# Patient Record
Sex: Male | Born: 2000 | Race: Black or African American | Hispanic: No | Marital: Single | Smoking: Never smoker
Health system: Southern US, Community
[De-identification: ages and names within clinical notes are randomized; demographics above are authoritative.]

## PROBLEM LIST (undated history)

## (undated) DIAGNOSIS — F419 Anxiety disorder, unspecified: Secondary | ICD-10-CM

## (undated) HISTORY — PX: WRIST SURGERY: SHX841

## (undated) HISTORY — PX: WISDOM TOOTH EXTRACTION: SHX21

---
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
## 2020-01-06 ENCOUNTER — Ambulatory Visit: Payer: 59 | Attending: Internal Medicine

## 2020-01-16 ENCOUNTER — Ambulatory Visit: Payer: 59 | Attending: Internal Medicine

## 2020-01-16 DIAGNOSIS — Z23 Encounter for immunization: Secondary | ICD-10-CM

## 2020-01-16 NOTE — Progress Notes (Signed)
   Covid-19 Vaccination Clinic  Name:  Haji Delaine    MRN: [id number] DOB: May 14, 2001  01/16/2020  Mr. Rasmusson was observed post Covid-19 immunization for 15 minutes without incident. He was provided with Vaccine Information Sheet and instruction to access the V-Safe system.   Mr. Gossman was instructed to call 911 with any severe reactions post vaccine: Marland Kitchen Difficulty breathing  . Swelling of face and throat  . A fast heartbeat  . A bad rash all over body  . Dizziness and weakness   Immunizations Administered    Name Date Dose VIS Date Route   Pfizer COVID-19 Vaccine 01/16/2020 11:54 AM 0.3 mL 10/29/2018 Intramuscular   Manufacturer: [employer], [employer]   Lot: [id number]   NDC: [address]-2

## 2021-12-06 ENCOUNTER — Other Ambulatory Visit: Payer: Self-pay

## 2021-12-06 ENCOUNTER — Encounter: Payer: Self-pay | Admitting: Emergency Medicine

## 2021-12-06 DIAGNOSIS — K2920 Alcoholic gastritis without bleeding: Secondary | ICD-10-CM | POA: Diagnosis not present

## 2021-12-06 DIAGNOSIS — Z91010 Allergy to peanuts: Secondary | ICD-10-CM | POA: Insufficient documentation

## 2021-12-06 DIAGNOSIS — E860 Dehydration: Secondary | ICD-10-CM | POA: Diagnosis not present

## 2021-12-06 DIAGNOSIS — R112 Nausea with vomiting, unspecified: Secondary | ICD-10-CM | POA: Diagnosis present

## 2021-12-06 DIAGNOSIS — U071 COVID-19: Secondary | ICD-10-CM | POA: Insufficient documentation

## 2021-12-06 LAB — CBC
HCT: 46.1 % (ref 39.0–52.0)
Hemoglobin: 15.8 g/dL (ref 13.0–17.0)
MCH: 30.3 pg (ref 26.0–34.0)
MCHC: 34.3 g/dL (ref 30.0–36.0)
MCV: 88.5 fL (ref 80.0–100.0)
Platelets: 248 10*3/uL (ref 150–400)
RBC: 5.21 MIL/uL (ref 4.22–5.81)
RDW: 11.9 % (ref 11.5–15.5)
WBC: 5 10*3/uL (ref 4.0–10.5)
nRBC: 0 % (ref 0.0–0.2)

## 2021-12-06 LAB — COMPREHENSIVE METABOLIC PANEL
ALT: 15 U/L (ref 0–44)
AST: 22 U/L (ref 15–41)
Albumin: 4.2 g/dL (ref 3.5–5.0)
Alkaline Phosphatase: 64 U/L (ref 38–126)
Anion gap: 11 (ref 5–15)
BUN: 21 mg/dL — ABNORMAL HIGH (ref 6–20)
CO2: 24 mmol/L (ref 22–32)
Calcium: 9.3 mg/dL (ref 8.9–10.3)
Chloride: 103 mmol/L (ref 98–111)
Creatinine, Ser: 1.35 mg/dL — ABNORMAL HIGH (ref 0.61–1.24)
GFR, Estimated: >60 mL/min (ref >60)
Glucose, Bld: 108 mg/dL — ABNORMAL HIGH (ref 70–99)
Potassium: 3.8 mmol/L (ref 3.5–5.1)
Sodium: 138 mmol/L (ref 135–145)
Total Bilirubin: 1.2 mg/dL (ref 0.3–1.2)
Total Protein: 8.5 g/dL — ABNORMAL HIGH (ref 6.5–8.1)

## 2021-12-06 LAB — LIPASE, BLOOD: Lipase: 26 U/L (ref 11–51)

## 2021-12-06 LAB — RESP PANEL BY RT-PCR (FLU A&B, COVID) ARPGX2
Influenza A by PCR: NEGATIVE
Influenza B by PCR: NEGATIVE
SARS Coronavirus 2 by RT PCR: POSITIVE — AB

## 2021-12-06 NOTE — ED Triage Notes (Signed)
Pt to ED from home c/o n/v/d and vomiting blood since this morning.  States vomiting x3-4 and diarrhea x2-3.  States vomiting has been intermittently bright red and then dark vomit.  Generalized body aches.  Denies blood thinners.  States last drank alcohol on Thursday, smokes marijuana every day and last use last night.  Pt A&Ox4, chest rise even and unlabored, skin WNL and in NAD at this time. ?

## 2021-12-07 ENCOUNTER — Emergency Department
Admission: EM | Admit: 2021-12-07 | Discharge: 2021-12-07 | Disposition: A | Discharge status: Home / Self Care | Payer: No Typology Code available for payment source | Attending: Emergency Medicine | Admitting: Emergency Medicine

## 2021-12-07 DIAGNOSIS — R112 Nausea with vomiting, unspecified: Secondary | ICD-10-CM

## 2021-12-07 DIAGNOSIS — U071 COVID-19: Secondary | ICD-10-CM

## 2021-12-07 DIAGNOSIS — K2920 Alcoholic gastritis without bleeding: Secondary | ICD-10-CM

## 2021-12-07 DIAGNOSIS — E860 Dehydration: Secondary | ICD-10-CM

## 2021-12-07 HISTORY — DX: Anxiety disorder, unspecified: F41.9

## 2021-12-07 LAB — URINALYSIS, ROUTINE W REFLEX MICROSCOPIC
Bacteria, UA: NONE SEEN
Bilirubin Urine: NEGATIVE
Glucose, UA: NEGATIVE mg/dL
Hgb urine dipstick: NEGATIVE
Ketones, ur: 5 mg/dL — AB
Leukocytes,Ua: NEGATIVE
Nitrite: NEGATIVE
Protein, ur: 30 mg/dL — AB
Specific Gravity, Urine: 1.032 — ABNORMAL HIGH (ref 1.005–1.030)
Squamous Epithelial / HPF: NONE SEEN (ref 0–5)
pH: 5 (ref 5.0–8.0)

## 2021-12-07 MED ORDER — ONDANSETRON HCL 4 MG/2ML IJ SOLN
4.0000 mg | Freq: Once | INTRAMUSCULAR | Status: AC
  Administered 2021-12-07: 4 mg via INTRAVENOUS
  Filled 2021-12-07: qty 2

## 2021-12-07 MED ORDER — FAMOTIDINE 20 MG PO TABS: 20.0000 mg | ORAL_TABLET | Freq: Two times a day (BID) | ORAL | 0 refills | Status: AC

## 2021-12-07 MED ORDER — SODIUM CHLORIDE 0.9 % IV BOLUS
1000.0000 mL | Freq: Once | INTRAVENOUS | Status: AC
  Administered 2021-12-07: 1000 mL via INTRAVENOUS

## 2021-12-07 MED ORDER — ONDANSETRON 4 MG PO TBDP: 4.0000 mg | ORAL_TABLET | Freq: Three times a day (TID) | ORAL | 0 refills | Status: AC | PRN

## 2021-12-07 MED ORDER — FAMOTIDINE IN NACL 20-0.9 MG/50ML-% IV SOLN
20.0000 mg | Freq: Once | INTRAVENOUS | Status: AC
  Administered 2021-12-07: 20 mg via INTRAVENOUS
  Filled 2021-12-07: qty 50

## 2021-12-07 NOTE — ED Provider Notes (Signed)
? ?[facility] ?Provider Note ? ? ? Event Date/Time  ? First MD Initiated Contact with Patient 12/07/21 0131   ?  (approximate) ? ? ?History  ? ?Vomiting, Diarrhea, and Hematemesis ? ? ?HPI ? ?Eddie Rubio is a 21 y.o. male who presents to the ED from home with a chief complaint of nausea, vomiting and diarrhea x1 day.  Reports 3-4 episodes of emesis and 2-3 episodes of diarrhea.  Reports bloody streaks in his vomit.  Also endorses generalized body aches.  Drank heavily last week, last drink 5 days ago.  Smokes marijuana every day, last used last evening.  Denies fever, cough, chest pain, shortness of breath, abdominal pain, dysuria. ?  ? ? ?Past Medical History  ? ?Past Medical History:  ?Diagnosis Date  ?? Anxiety   ? ? ? ?Active Problem List  ?There are no problems to display for this patient. ? ? ? ?Past Surgical History  ? ?Past Surgical History:  ?Procedure Laterality Date  ?? WISDOM TOOTH EXTRACTION    ?? WRIST SURGERY Right   ? ? ? ?Home Medications  ? ?Prior to Admission medications   ?Not on File  ? ? ? ?Allergies  ?Peanut (diagnostic) ? ? ?Family History  ?History reviewed. No pertinent family history. ? ? ?Physical Exam  ?Triage Vital Signs: ?ED Triage Vitals  ?Enc Vitals Group  ?   BP 12/06/21 1927 126/77  ?   Pulse Rate 12/06/21 1927 73  ?   Resp 12/06/21 1927 18  ?   Temp 12/06/21 1927 99.8 ?F (37.7 ?C)  ?   Temp Source 12/06/21 1927 Oral  ?   SpO2 12/06/21 1927 97 %  ?   Weight 12/06/21 1935 160 lb (72.6 kg)  ?   Height 12/06/21 1935 6\' 1"  (1.854 m)  ?   Head Circumference --   ?   Peak Flow --   ?   Pain Score 12/06/21 1934 2  ?   Pain Loc --   ?   Pain Edu? --   ?   Excl. in GC? --   ? ? ?Updated Vital Signs: ?BP 133/72 (BP Location: Right Arm)   Pulse 72   Temp 99.8 ?F (37.7 ?C) (Oral)   Resp 16   Ht 6\' 1"  (1.854 m)   Wt 72.6 kg   SpO2 98%   BMI 21.11 kg/m?  ? ? ?General: Awake, no distress.  ?CV:  RRR.  Good peripheral perfusion.  ?Resp:  Normal effort.  CTA  B. ?Abd:  Nontender to light or deep palpation.  No distention.  ?Other:  No vesicles. ? ? ?ED Results / Procedures / Treatments  ?Labs ?(all labs ordered are listed, but only abnormal results are displayed) ?Labs Reviewed  ?RESP PANEL BY RT-PCR (FLU A&B, COVID) ARPGX2 - Abnormal; Notable for the following components:  ?    Result Value  ? SARS Coronavirus 2 by RT PCR POSITIVE (*)   ? All other components within normal limits  ?COMPREHENSIVE METABOLIC PANEL - Abnormal; Notable for the following components:  ? Glucose, Bld 108 (*)   ? BUN 21 (*)   ? Creatinine, Ser 1.35 (*)   ? Total Protein 8.5 (*)   ? All other components within normal limits  ?URINALYSIS, ROUTINE W REFLEX MICROSCOPIC - Abnormal; Notable for the following components:  ? Color, Urine AMBER (*)   ? APPearance HAZY (*)   ? Specific Gravity, Urine 1.032 (*)   ? Ketones, ur 5 (*)   ?  Protein, ur 30 (*)   ? All other components within normal limits  ?LIPASE, BLOOD  ?CBC  ? ? ? ?EKG ? ?None ? ? ?RADIOLOGY ?None ? ? ?Official radiology report(s): ?No results found. ? ? ?PROCEDURES: ? ?Critical Care performed: No ? ?Procedures ? ? ?MEDICATIONS ORDERED IN ED: ?Medications  ?sodium chloride 0.9 % bolus 1,000 mL (1,000 mLs Intravenous New Bag/Given 12/07/21 0155)  ?famotidine (PEPCID) IVPB 20 mg premix (20 mg Intravenous New Bag/Given 12/07/21 0157)  ?ondansetron [facility]) injection 4 mg (4 mg Intravenous Given 12/07/21 0155)  ? ? ? ?IMPRESSION / MDM / ASSESSMENT AND PLAN / ED COURSE  ?I reviewed the triage vital signs and the nursing notes. ?             ?               ?21 year old otherwise healthy male presenting with nausea/vomiting and diarrhea. Differential diagnosis includes, but is not limited to, acute appendicitis, renal colic, testicular torsion, urinary tract infection/pyelonephritis, prostatitis,  epididymitis, diverticulitis, small bowel obstruction or ileus, colitis, abdominal aortic aneurysm, gastroenteritis, hernia, etc. I have personally reviewed  patient's records and see a recent PCP office visit on 11/09/2021 for GAD. ? ?Laboratory results demonstrate normal WBC, creatinine 1.35 without prior for comparison; COVID is positive.  Do not feel imaging is warranted as patient is not having cough nor abdominal pain.  Will initiate IV fluid resuscitation, IV Pepcid for likely gastritis and IV Zofran for nausea.  Will reassess. ? ?Clinical Course as of 12/07/21 0320  ?Wed Dec 07, 2021  ?0319 Patient feeling better.  Will discharge home after completion of IV fluids.  Will discharge home with as needed Zofran prescription.  Tricked return precautions given.  Patient verbalizes understanding and agrees with plan of care. [JS]  ?  ?Clinical Course User Index ?[JS] Irean Hong, MD  ? ? ? ?FINAL CLINICAL IMPRESSION(S) / ED DIAGNOSES  ? ?Final diagnoses:  ?Nausea vomiting and diarrhea  ?Dehydration  ?COVID-19  ?Acute alcoholic gastritis without hemorrhage  ? ? ? ?Rx / DC Orders  ? ?ED Discharge Orders   ? ? None  ? ?  ? ? ? ?Note:  This document was prepared using Dragon voice recognition software and may include unintentional dictation errors. ?  ?Irean Hong, MD ?12/07/21 [phone] ? ?

## 2021-12-07 NOTE — Discharge Instructions (Signed)
1.  Start Pepcid 20 mg twice daily. ?2.  You may take Zofran as needed for nausea or vomiting. ?3.  Clear liquids x12 hours, then [employer] x3 days, then slowly advance diet as tolerated. ?4.  Return to the ER for worsening symptoms, persistent vomiting, difficulty breathing or other concerns. ?
